# Patient Record
Sex: Female | Born: 1990 | Hispanic: Yes | Marital: Single | Smoking: Never smoker
Health system: Southern US, Community
[De-identification: ages and names within clinical notes are randomized; demographics above are authoritative.]

## PROBLEM LIST (undated history)

## (undated) DIAGNOSIS — F209 Schizophrenia, unspecified: Secondary | ICD-10-CM

---

## 2013-02-09 ENCOUNTER — Emergency Department (HOSPITAL_COMMUNITY)
Admission: EM | Admit: 2013-02-09 | Discharge: 2013-02-09 | Disposition: A | Discharge status: Home / Self Care | Payer: Self-pay | Attending: Emergency Medicine | Admitting: Emergency Medicine

## 2013-02-09 ENCOUNTER — Encounter (HOSPITAL_COMMUNITY): Payer: Self-pay | Admitting: Emergency Medicine

## 2013-02-09 DIAGNOSIS — G249 Dystonia, unspecified: Secondary | ICD-10-CM

## 2013-02-09 DIAGNOSIS — G248 Other dystonia: Secondary | ICD-10-CM | POA: Insufficient documentation

## 2013-02-09 DIAGNOSIS — R279 Unspecified lack of coordination: Secondary | ICD-10-CM | POA: Insufficient documentation

## 2013-02-09 DIAGNOSIS — F209 Schizophrenia, unspecified: Secondary | ICD-10-CM | POA: Insufficient documentation

## 2013-02-09 DIAGNOSIS — Z3202 Encounter for pregnancy test, result negative: Secondary | ICD-10-CM | POA: Insufficient documentation

## 2013-02-09 DIAGNOSIS — R000 Tachycardia, unspecified: Secondary | ICD-10-CM | POA: Insufficient documentation

## 2013-02-09 HISTORY — DX: Schizophrenia, unspecified: F20.9

## 2013-02-09 LAB — URINALYSIS, ROUTINE W REFLEX MICROSCOPIC
Bilirubin Urine: NEGATIVE
GLUCOSE, UA: NEGATIVE mg/dL
HGB URINE DIPSTICK: NEGATIVE
Ketones, ur: NEGATIVE mg/dL
Nitrite: NEGATIVE
PH: 7 (ref 5.0–8.0)
Protein, ur: NEGATIVE mg/dL
Specific Gravity, Urine: 1.006 (ref 1.005–1.030)
UROBILINOGEN UA: 0.2 mg/dL (ref 0.0–1.0)

## 2013-02-09 LAB — URINE MICROSCOPIC-ADD ON

## 2013-02-09 LAB — POCT PREGNANCY, URINE: Preg Test, Ur: NEGATIVE

## 2013-02-09 LAB — COMPREHENSIVE METABOLIC PANEL
ALBUMIN: 4 g/dL (ref 3.5–5.2)
ALK PHOS: 88 U/L (ref 39–117)
ALT: 17 U/L (ref 0–35)
AST: 27 U/L (ref 0–37)
BILIRUBIN TOTAL: 0.2 mg/dL — AB (ref 0.3–1.2)
BUN: 5 mg/dL — ABNORMAL LOW (ref 6–23)
CHLORIDE: 102 meq/L (ref 96–112)
CO2: 21 mEq/L (ref 19–32)
Calcium: 9.1 mg/dL (ref 8.4–10.5)
Creatinine, Ser: 0.54 mg/dL (ref 0.50–1.10)
GFR calc Af Amer: >90 mL/min (ref >90)
GFR calc non Af Amer: >90 mL/min (ref >90)
GLUCOSE: 113 mg/dL — AB (ref 70–99)
POTASSIUM: 4.1 meq/L (ref 3.7–5.3)
Sodium: 138 mEq/L (ref 137–147)
Total Protein: 8.3 g/dL (ref 6.0–8.3)

## 2013-02-09 LAB — CBC
HEMATOCRIT: 40.7 % (ref 36.0–46.0)
HEMOGLOBIN: 13.4 g/dL (ref 12.0–15.0)
MCH: 28.4 pg (ref 26.0–34.0)
MCHC: 32.9 g/dL (ref 30.0–36.0)
MCV: 86.2 fL (ref 78.0–100.0)
Platelets: 321 10*3/uL (ref 150–400)
RBC: 4.72 MIL/uL (ref 3.87–5.11)
RDW: 13.4 % (ref 11.5–15.5)
WBC: 11.2 10*3/uL — AB (ref 4.0–10.5)

## 2013-02-09 LAB — RAPID URINE DRUG SCREEN, HOSP PERFORMED
Amphetamines: NOT DETECTED
Barbiturates: NOT DETECTED
Benzodiazepines: NOT DETECTED
COCAINE: NOT DETECTED
Opiates: NOT DETECTED
TETRAHYDROCANNABINOL: NOT DETECTED

## 2013-02-09 LAB — ACETAMINOPHEN LEVEL

## 2013-02-09 LAB — ETHANOL

## 2013-02-09 LAB — SALICYLATE LEVEL: Salicylate Lvl: <2 mg/dL — ABNORMAL LOW (ref 2.8–20.0)

## 2013-02-09 MED ORDER — SODIUM CHLORIDE 0.9 % IV BOLUS (SEPSIS)
1000.0000 mL | Freq: Once | INTRAVENOUS | Status: AC
  Administered 2013-02-09: 1000 mL via INTRAVENOUS

## 2013-02-09 MED ORDER — LORAZEPAM 2 MG/ML IJ SOLN
1.0000 mg | Freq: Once | INTRAMUSCULAR | Status: AC
  Administered 2013-02-09: 1 mg via INTRAVENOUS
  Filled 2013-02-09: qty 1

## 2013-02-09 MED ORDER — DIPHENHYDRAMINE HCL 25 MG PO TABS: 25.0000 mg | ORAL_TABLET | Freq: Four times a day (QID) | ORAL | Status: DC

## 2013-02-09 MED ORDER — DIPHENHYDRAMINE HCL 50 MG/ML IJ SOLN
25.0000 mg | Freq: Once | INTRAMUSCULAR | Status: AC
  Administered 2013-02-09: 25 mg via INTRAVENOUS
  Filled 2013-02-09: qty 1

## 2013-02-09 NOTE — ED Notes (Signed)
Pt verbalized understanding of d/c instructions Pt ambulatory to d/c with family

## 2013-02-09 NOTE — ED Notes (Signed)
Per EMS, [facility] picked [facility] as [facility] has been seen [facility]. [facility] called 911 for possible overdose. Patient was at night club earlier in evening, reports drinking a margarita and pina colada at club. On arrival patient denies ETOH, states she had a sip of someone elses drink. Patient denies any prior similar episodes, patient mother states this happened about 2 months ago and [facility].

## 2013-02-09 NOTE — Discharge Instructions (Signed)
It is unclear at this time what caused your dystonic reaction.  Usually it is in response to a medication or drug.  It is important for you to followup with a neurologist and your mental health provider.  You will need to be careful about what medications you take.  Return to emergency department for worsening condition or new concerning symptoms.   Reaccin distnica (Dystonic Reaction) La reaccin distnica se produce generalmente como efecto secundario a una medicacin especfica. Generalmente es por medicamentos que se utilizan para tratar trastornos psicolgicos o psiquitricos. Tambin pueden producirse debido a otros medicamentos de uso frecuente como el antihistamnicos, cimetidina, doxepina y bromocriptina. El motivo para que esta reaccin se produzca es que los patrones normales de los receptores nerviosos son alterados por un medicamento especfico y el desequilibrio produce diferentes tipos de espasmos musculares. No se trata de alergia al medicamento. Es Neomia Dearuna respuesta particular al medicamento especfico que usted est tomando. DIAGNSTICO Este diagnstico (determinar cul es el problema) se realiza en base a los sntomas (problemas) ms evidentes que se relacionan con la contraccin de mltiples msculos del organismo, y en general, la rpida respuesta al State Linetratamiento. Debido a que se produce la contraccin de mltiples grupos musculares, se asocia a movimientos anormales del rostro, de la [place]engua, el cuello, el abdomen (vientre), la espalda y Buckhornmuecas extraas. Este trastorno rara vez pone en peligro la vida y generalmente responde minutos despus de administrar Benadryl, Cogentin o Valium. Aunque en general es atemorizante, se resolver en pocos minutos. Si la reaccin no es consecuencia del consumo de medicamentos, debern realizarle estudios adicionales para descartar otras causas. INSTRUCCIONES PARA EL CUIDADO DOMICILIARIO  En general, despus que la reaccin desaparece no retorna.  Evite  en el futuro el uso de medicamentos que se han considerado como la causa del trastorno.  No conduzca ni realice tareas despus del tratamiento hasta que haya terminado los medicamentos o hasta que tenga la autorizacin del profesional que lo asiste.  Vea a su profesional si los sntomas que lo llevaron a la consulta o a la sala de Clinical biochemistemergencias aparecen nuevamente. EST SEGURO QUE:   Comprende las instrucciones para el alta mdica.  Controlar su enfermedad.  Solicitar atencin mdica de inmediato segn las indicaciones. Document Released: 01/03/2005 Document Revised: 03/28/2011 [facility] Patient Information 2014 [place], [place].

## 2013-02-09 NOTE — ED Provider Notes (Signed)
CSN: [id number]     Arrival date & time 02/09/13  [id number] History   First MD Initiated Contact with Patient 02/09/13 0239     Chief Complaint  Patient presents with  . Medical Clearance   (Consider location/radiation/quality/duration/timing/severity/associated sxs/prior Treatment) HPI 23 year old female presents [facility].  History given by patient, family, and EMS.  Per report, patient was out at a club, and began acting bizarrely.  Patient reports drinking a little bit of a margarita and a pina colada.  They were not her drinks.  She is unsure if she was slipped something in the drink.  Patient reports eating shrimp, beans and rice, and cheese.  Initially, she denies this ever happened before, but then admits that happened 2 months ago.  Family reports [facility].  Patient has past history of schizophrenia.  It is reported that she takes Risperdal, but it is unclear if she is actually taking it.  She is a poor historian.  Patient with deviation of the eyes upward, and to the right.  She is unable to look down.  She periodically opens her mouth, and attempts to vomit, but is unable to.  She occasionally  keeps her mouth open, rigidly.  Patient has some tonic activity of her extremities without seizure.  She denies any ingestion of any illicit substances.  [facility] and transferred over to to consider for orders. Past Medical History  Diagnosis Date  . Schizophrenia    No past surgical history on file. No family history on file. History  Substance Use Topics  . Smoking status: Not on file  . Smokeless tobacco: Not on file  . Alcohol Use: Yes   OB History   Grav Para Term Preterm Abortions TAB SAB Ect Mult Living                 Review of Systems  Unable to perform ROS: Mental status change    Allergies  Review of patient's allergies indicates no known allergies.  Home  Medications   Current Outpatient Rx  Name  Route  Sig  Dispense  Refill  . risperidone (RISPERDAL) 4 MG tablet   Oral   Take 4 mg by mouth at bedtime.          BP 140/88  Pulse 125  Temp(Src) 97.5 F (36.4 C) (Oral)  Resp 18  Ht 5\' 2"  (1.575 m)  Wt 140 lb (63.504 kg)  BMI 25.60 kg/m2  SpO2 97% Physical Exam  Nursing note and vitals reviewed. Constitutional: She is oriented to person, place, and time. She appears well-developed and well-nourished. She appears distressed.  HENT:  Head: Normocephalic and atraumatic.  Right Ear: External ear normal.  Left Ear: External ear normal.  Nose: Nose normal.  Mouth/Throat: Oropharynx is clear and moist.  Eyes: Conjunctivae are normal.  deviation of eyes up into the right. some nystagmus  Neck: Normal range of motion. Neck supple. No JVD present. No tracheal deviation present. No thyromegaly present.  Cardiovascular: Regular rhythm, normal heart sounds and intact distal pulses.  Exam reveals no gallop and no friction rub.   No murmur heard. Tachycardia  Pulmonary/Chest: Effort normal and breath sounds normal. No stridor. No respiratory distress. She has no wheezes. She has no rales. She exhibits no tenderness.  Abdominal: Soft. Bowel sounds are normal. She exhibits no distension and no mass. There is no tenderness. There is no rebound  and no guarding.  Musculoskeletal: Normal range of motion. She exhibits no edema and no tenderness.  Lymphadenopathy:    She has no cervical adenopathy.  Neurological: She is alert and oriented to person, place, and time. No cranial nerve deficit. She exhibits abnormal muscle tone ( increased rigidity noted). Coordination abnormal.  Skin: Skin is warm and dry. No rash noted. No erythema. No pallor.  Psychiatric: She has a normal mood and affect. Her behavior is normal. Judgment and thought content normal.    ED Course  Procedures (including critical care time) Labs Review Labs Reviewed  URINALYSIS,  ROUTINE W REFLEX MICROSCOPIC - Abnormal; Notable for the following:    APPearance CLOUDY (*)    Leukocytes, UA TRACE (*)    All other components within normal limits  CBC - Abnormal; Notable for the following:    WBC 11.2 (*)    All other components within normal limits  COMPREHENSIVE METABOLIC PANEL - Abnormal; Notable for the following:    Glucose, Bld 113 (*)    BUN 5 (*)    Total Bilirubin 0.2 (*)    All other components within normal limits  SALICYLATE LEVEL - Abnormal; Notable for the following:    Salicylate Lvl <2.0 (*)    All other components within normal limits  URINE MICROSCOPIC-ADD ON - Abnormal; Notable for the following:    Squamous Epithelial / LPF FEW (*)    All other components within normal limits  URINE RAPID DRUG SCREEN (HOSP PERFORMED)  ACETAMINOPHEN LEVEL  ETHANOL  POCT PREGNANCY, URINE   Imaging Review No results found.  EKG Interpretation    Date/Time:  Saturday February 09 2013 02:49:39 EST Ventricular Rate:  156 PR Interval:  137 QRS Duration: 74 QT Interval:  247 QTC Calculation: 398 R Axis:   45 Text Interpretation:  Sinus tachycardia Artifact in lead(s) I II III aVR aVL aVF No old tracing to compare Confirmed by Docia Klar  MD, Sultana Tierney ([id number]) on 02/09/2013 2:53:12 AM            MDM   1. Dystonia    23 year old female with what appears to be a dystonic reaction.  Unknown trigger.  After administration of Benadryl, patient with complete resolution of symptoms.  Workup has been unremarkable.    Olivia Mackie, MD 02/09/13 [phone]

## 2013-02-09 NOTE — ED Notes (Signed)
Patient presents with eyes rolling back in her head, unable to move them forward. Patient states she only drank water tonight at the club, then states she had only a sip of her friends drink. Patient now more alert, able to communicate more effectively. Patient eyes now at a normal gaze, able to speak in coherent sentences. Patient states she had a margarita and a Cabin crewpina colada tonight, that someone else brought her drinks to her. Patient's family at bedside.

## 2013-02-09 NOTE — ED Notes (Signed)
Bed: RESA Expected date: 02/09/13 Expected time: 2:30 AM Means of arrival: Ambulance Comments: Res A, EMS, 22 F, Med Clearance

## 2013-05-18 ENCOUNTER — Encounter (HOSPITAL_COMMUNITY): Payer: Self-pay | Admitting: Emergency Medicine

## 2013-05-18 ENCOUNTER — Emergency Department (HOSPITAL_COMMUNITY)
Admission: EM | Admit: 2013-05-18 | Discharge: 2013-05-18 | Disposition: A | Discharge status: Home / Self Care | Payer: Self-pay | Attending: Emergency Medicine | Admitting: Emergency Medicine

## 2013-05-18 DIAGNOSIS — Z3202 Encounter for pregnancy test, result negative: Secondary | ICD-10-CM | POA: Insufficient documentation

## 2013-05-18 DIAGNOSIS — Z8659 Personal history of other mental and behavioral disorders: Secondary | ICD-10-CM | POA: Insufficient documentation

## 2013-05-18 DIAGNOSIS — F3289 Other specified depressive episodes: Secondary | ICD-10-CM | POA: Insufficient documentation

## 2013-05-18 DIAGNOSIS — F329 Major depressive disorder, single episode, unspecified: Secondary | ICD-10-CM | POA: Insufficient documentation

## 2013-05-18 DIAGNOSIS — R000 Tachycardia, unspecified: Secondary | ICD-10-CM | POA: Insufficient documentation

## 2013-05-18 DIAGNOSIS — G248 Other dystonia: Secondary | ICD-10-CM | POA: Insufficient documentation

## 2013-05-18 DIAGNOSIS — G249 Dystonia, unspecified: Secondary | ICD-10-CM

## 2013-05-18 LAB — CBC WITH DIFFERENTIAL/PLATELET
Basophils Absolute: 0 10*3/uL (ref 0.0–0.1)
Basophils Relative: 0 % (ref 0–1)
Eosinophils Absolute: 0.7 10*3/uL (ref 0.0–0.7)
Eosinophils Relative: 4 % (ref 0–5)
HEMATOCRIT: 38.6 % (ref 36.0–46.0)
Hemoglobin: 12.9 g/dL (ref 12.0–15.0)
LYMPHS ABS: 4.7 10*3/uL — AB (ref 0.7–4.0)
Lymphocytes Relative: 29 % (ref 12–46)
MCH: 28.2 pg (ref 26.0–34.0)
MCHC: 33.4 g/dL (ref 30.0–36.0)
MCV: 84.3 fL (ref 78.0–100.0)
MONO ABS: 1.4 10*3/uL — AB (ref 0.1–1.0)
MONOS PCT: 8 % (ref 3–12)
NEUTROS ABS: 9.8 10*3/uL — AB (ref 1.7–7.7)
NEUTROS PCT: 59 % (ref 43–77)
Platelets: 368 10*3/uL (ref 150–400)
RBC: 4.58 MIL/uL (ref 3.87–5.11)
RDW: 13.9 % (ref 11.5–15.5)
WBC: 16.6 10*3/uL — ABNORMAL HIGH (ref 4.0–10.5)

## 2013-05-18 LAB — COMPREHENSIVE METABOLIC PANEL
ALK PHOS: 103 U/L (ref 39–117)
ALT: 16 U/L (ref 0–35)
AST: 23 U/L (ref 0–37)
Albumin: 3.7 g/dL (ref 3.5–5.2)
BUN: 5 mg/dL — AB (ref 6–23)
CHLORIDE: 101 meq/L (ref 96–112)
CO2: 19 mEq/L (ref 19–32)
Calcium: 8.8 mg/dL (ref 8.4–10.5)
Creatinine, Ser: 0.51 mg/dL (ref 0.50–1.10)
GLUCOSE: 109 mg/dL — AB (ref 70–99)
POTASSIUM: 3.8 meq/L (ref 3.7–5.3)
Sodium: 138 mEq/L (ref 137–147)
Total Protein: 7.8 g/dL (ref 6.0–8.3)

## 2013-05-18 LAB — RAPID URINE DRUG SCREEN, HOSP PERFORMED
AMPHETAMINES: NOT DETECTED
BENZODIAZEPINES: NOT DETECTED
Barbiturates: NOT DETECTED
COCAINE: NOT DETECTED
Opiates: NOT DETECTED
TETRAHYDROCANNABINOL: NOT DETECTED

## 2013-05-18 LAB — URINALYSIS, ROUTINE W REFLEX MICROSCOPIC
BILIRUBIN URINE: NEGATIVE
Glucose, UA: NEGATIVE mg/dL
Hgb urine dipstick: NEGATIVE
Ketones, ur: NEGATIVE mg/dL
LEUKOCYTES UA: NEGATIVE
NITRITE: NEGATIVE
PROTEIN: NEGATIVE mg/dL
Specific Gravity, Urine: 1.01 (ref 1.005–1.030)
Urobilinogen, UA: 0.2 mg/dL (ref 0.0–1.0)
pH: 6.5 (ref 5.0–8.0)

## 2013-05-18 LAB — ETHANOL: Alcohol, Ethyl (B): <11 mg/dL (ref 0–11)

## 2013-05-18 LAB — SALICYLATE LEVEL: Salicylate Lvl: <2 mg/dL — ABNORMAL LOW (ref 2.8–20.0)

## 2013-05-18 LAB — PREGNANCY, URINE: Preg Test, Ur: NEGATIVE

## 2013-05-18 MED ORDER — DIPHENHYDRAMINE HCL 50 MG/ML IJ SOLN
25.0000 mg | Freq: Once | INTRAMUSCULAR | Status: AC
  Administered 2013-05-18: 25 mg via INTRAVENOUS
  Filled 2013-05-18: qty 1

## 2013-05-18 MED ORDER — DIPHENHYDRAMINE HCL 25 MG PO CAPS: 25.0000 mg | ORAL_CAPSULE | Freq: Four times a day (QID) | ORAL | Status: DC | PRN

## 2013-05-18 MED ORDER — SODIUM CHLORIDE 0.9 % IV BOLUS (SEPSIS)
1000.0000 mL | Freq: Once | INTRAVENOUS | Status: AC
  Administered 2013-05-18: 1000 mL via INTRAVENOUS

## 2013-05-18 NOTE — ED Provider Notes (Addendum)
CSN: [id number]     Arrival date & time 05/18/13  1823 History   First MD Initiated Contact with Patient 05/18/13 1914     Chief Complaint  Patient presents with  . facial weakness      (Consider location/radiation/quality/duration/timing/severity/associated sxs/prior Treatment) The history is provided by the patient. The history is limited by the condition of the patient.   Marisa Chambers is a 23 y.o. female history of schizophrenia here presenting with possible dystonia. She states that she has been taking the herbal and probiotic detox pill daily and took in an hour prior to arrival. She then developed some dystonia as per EMS. She was looking up and having facial spasms. In January for the same thing and improved with Benadryl. Denies overdosing on her medicines to deny suicidal homicidal ideations.    Past Medical History  Diagnosis Date  . Schizophrenia    History reviewed. No pertinent past surgical history. No family history on file. History  Substance Use Topics  . Smoking status: Never Smoker   . Smokeless tobacco: Not on file  . Alcohol Use: Yes   OB History   Grav Para Term Preterm Abortions TAB SAB Ect Mult Living                 Review of Systems  Musculoskeletal:       Muscle spasms   All other systems reviewed and are negative.     Allergies  Review of patient's allergies indicates no known allergies.  Home Medications   Prior to Admission medications   Medication Sig Start Date End Date Taking? Authorizing Provider  diphenhydrAMINE (BENADRYL) 25 MG tablet Take 1 tablet (25 mg total) by mouth every 6 (six) hours. As needed for dystonic reaction 02/09/13   Olivia Mackielga M Otter, MD   BP 127/80  Pulse 117  Temp(Src) 97.9 F (36.6 C) (Oral)  Resp 18  SpO2 96% Physical Exam  Nursing note and vitals reviewed. Constitutional: She is oriented to person, place, and time.  Eyes looking up, tight facial muscles   HENT:  Head: Normocephalic.   Mouth/Throat: Oropharynx is clear and moist.  Mild trismus.   Eyes: Conjunctivae and EOM are normal. Pupils are equal, round, and reactive to light.  Looking up constantly.   Neck: Normal range of motion. Neck supple.  Cardiovascular: Regular rhythm and normal heart sounds.   Tachycardic   Pulmonary/Chest: Effort normal and breath sounds normal. No respiratory distress. She has no wheezes. She has no rales.  Abdominal: Soft. Bowel sounds are normal. She exhibits no distension. There is no tenderness. There is no rebound.  Musculoskeletal: Normal range of motion. She exhibits no edema and no tenderness.  Neurological: She is alert and oriented to person, place, and time. No cranial nerve deficit. Coordination normal.  No tremors. Nl gait. Nl strength throughout   Skin: Skin is warm and dry.  Psychiatric:  Slightly depressed, not suicidal     ED Course  Procedures (including critical care time) Labs Review Labs Reviewed  CBC WITH DIFFERENTIAL - Abnormal; Notable for the following:    WBC 16.6 (*)    Neutro Abs 9.8 (*)    Lymphs Abs 4.7 (*)    Monocytes Absolute 1.4 (*)    All other components within normal limits  COMPREHENSIVE METABOLIC PANEL - Abnormal; Notable for the following:    Glucose, Bld 109 (*)    BUN 5 (*)    Total Bilirubin <0.2 (*)    All  other components within normal limits  SALICYLATE LEVEL - Abnormal; Notable for the following:    Salicylate Lvl <2.0 (*)    All other components within normal limits  ETHANOL  URINE RAPID DRUG SCREEN (HOSP PERFORMED)  URINALYSIS, ROUTINE W REFLEX MICROSCOPIC  PREGNANCY, URINE    Imaging Review No results found.   EKG Interpretation None      MDM   Final diagnoses:  None    Marisa Chambers is a 23 y.o. female here with dystonic reaction. Not on antipsychotic right now. Unclear what its from. She is taking herbal supplement which I asked her to stop taking. Given benadryl in the ED, tachycardia  improved and now not dystonic, trismus improved. Labs showed leukocytosis but no fever and no other signs of sepsis. I doubt NMS or serotonin syndrome.  UDS neg. Stable for d/c.      Richardean Canalavid H Yao, MD 05/18/13 2023  Richardean Canalavid H Yao, MD 05/18/13 2033

## 2013-05-18 NOTE — ED Notes (Signed)
Pt rolling eyes in back of head and will not look at this RN. Pupil's checked with pin light and patient then focuses eyes on RN. Pt holding eyes open with fingers periodically.

## 2013-05-18 NOTE — ED Notes (Signed)
Per EMS - pt comes from home where she lives with her family with c/o facial paralysis.  Pt took an herbal and probiotic detox pill to help her sleep @ 1 hour PTA.  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  Pt was seen here in January 2015 for dystonia.  Pt's vitals on scene:  142/78, st 120, RR 18, 98 on RA.

## 2013-05-18 NOTE — ED Notes (Signed)
Bed: [id number] Expected date: 05/18/13 Expected time: 6:23 PM Means of arrival: Ambulance Comments: Evaluation

## 2013-05-18 NOTE — ED Notes (Signed)
Pt ambulated to restroom with steady gait.

## 2013-05-18 NOTE — Discharge Instructions (Signed)
Avoid taking herbal supplement.   Take benadryl as needed for muscle cramps.   Follow up with your doctor.   Return to ER if you have fever, worse cramps or spasms.

## 2013-06-01 ENCOUNTER — Emergency Department (HOSPITAL_COMMUNITY)
Admission: EM | Admit: 2013-06-01 | Discharge: 2013-06-01 | Disposition: A | Discharge status: Home / Self Care | Payer: Self-pay | Attending: Emergency Medicine | Admitting: Emergency Medicine

## 2013-06-01 ENCOUNTER — Encounter (HOSPITAL_COMMUNITY): Payer: Self-pay | Admitting: Emergency Medicine

## 2013-06-01 DIAGNOSIS — Z3202 Encounter for pregnancy test, result negative: Secondary | ICD-10-CM | POA: Insufficient documentation

## 2013-06-01 DIAGNOSIS — N760 Acute vaginitis: Secondary | ICD-10-CM | POA: Insufficient documentation

## 2013-06-01 DIAGNOSIS — R109 Unspecified abdominal pain: Secondary | ICD-10-CM

## 2013-06-01 DIAGNOSIS — B9689 Other specified bacterial agents as the cause of diseases classified elsewhere: Secondary | ICD-10-CM | POA: Insufficient documentation

## 2013-06-01 DIAGNOSIS — A499 Bacterial infection, unspecified: Secondary | ICD-10-CM | POA: Insufficient documentation

## 2013-06-01 DIAGNOSIS — Z8659 Personal history of other mental and behavioral disorders: Secondary | ICD-10-CM | POA: Insufficient documentation

## 2013-06-01 LAB — URINALYSIS, ROUTINE W REFLEX MICROSCOPIC
Bilirubin Urine: NEGATIVE
Glucose, UA: NEGATIVE mg/dL
Hgb urine dipstick: NEGATIVE
Ketones, ur: NEGATIVE mg/dL
LEUKOCYTES UA: NEGATIVE
NITRITE: NEGATIVE
PROTEIN: NEGATIVE mg/dL
SPECIFIC GRAVITY, URINE: 1.011 (ref 1.005–1.030)
UROBILINOGEN UA: 0.2 mg/dL (ref 0.0–1.0)
pH: 7 (ref 5.0–8.0)

## 2013-06-01 LAB — CBC WITH DIFFERENTIAL/PLATELET
BASOS PCT: 0 % (ref 0–1)
Basophils Absolute: 0 10*3/uL (ref 0.0–0.1)
EOS ABS: 0.7 10*3/uL (ref 0.0–0.7)
Eosinophils Relative: 6 % — ABNORMAL HIGH (ref 0–5)
HCT: 36.7 % (ref 36.0–46.0)
Hemoglobin: 12.2 g/dL (ref 12.0–15.0)
Lymphocytes Relative: 23 % (ref 12–46)
Lymphs Abs: 2.7 10*3/uL (ref 0.7–4.0)
MCH: 28.2 pg (ref 26.0–34.0)
MCHC: 33.2 g/dL (ref 30.0–36.0)
MCV: 84.8 fL (ref 78.0–100.0)
MONOS PCT: 8 % (ref 3–12)
Monocytes Absolute: 0.9 10*3/uL (ref 0.1–1.0)
NEUTROS PCT: 63 % (ref 43–77)
Neutro Abs: 7.2 10*3/uL (ref 1.7–7.7)
PLATELETS: 407 10*3/uL — AB (ref 150–400)
RBC: 4.33 MIL/uL (ref 3.87–5.11)
RDW: 14.5 % (ref 11.5–15.5)
WBC: 11.5 10*3/uL — ABNORMAL HIGH (ref 4.0–10.5)

## 2013-06-01 LAB — WET PREP, GENITAL
TRICH WET PREP: NONE SEEN
YEAST WET PREP: NONE SEEN

## 2013-06-01 LAB — COMPREHENSIVE METABOLIC PANEL
ALT: 14 U/L (ref 0–35)
AST: 19 U/L (ref 0–37)
Albumin: 3.8 g/dL (ref 3.5–5.2)
Alkaline Phosphatase: 93 U/L (ref 39–117)
BUN: 6 mg/dL (ref 6–23)
CALCIUM: 9.4 mg/dL (ref 8.4–10.5)
CO2: 23 mEq/L (ref 19–32)
Chloride: 101 mEq/L (ref 96–112)
Creatinine, Ser: 0.55 mg/dL (ref 0.50–1.10)
GFR calc non Af Amer: >90 mL/min (ref >90)
GLUCOSE: 102 mg/dL — AB (ref 70–99)
POTASSIUM: 4 meq/L (ref 3.7–5.3)
Sodium: 137 mEq/L (ref 137–147)
TOTAL PROTEIN: 7.7 g/dL (ref 6.0–8.3)
Total Bilirubin: 0.3 mg/dL (ref 0.3–1.2)

## 2013-06-01 LAB — LIPASE, BLOOD: LIPASE: 26 U/L (ref 11–59)

## 2013-06-01 LAB — HIV ANTIBODY (ROUTINE TESTING W REFLEX): HIV 1&2 Ab, 4th Generation: NONREACTIVE

## 2013-06-01 LAB — POC URINE PREG, ED: PREG TEST UR: NEGATIVE

## 2013-06-01 MED ORDER — METRONIDAZOLE 500 MG PO TABS
2000.0000 mg | ORAL_TABLET | Freq: Once | ORAL | Status: AC
  Administered 2013-06-01: 2000 mg via ORAL
  Filled 2013-06-01: qty 4

## 2013-06-01 MED ORDER — PANTOPRAZOLE SODIUM 20 MG PO TBEC: 20.0000 mg | DELAYED_RELEASE_TABLET | Freq: Every day | ORAL | Status: AC

## 2013-06-01 NOTE — ED Notes (Signed)
Pt did not have a PIV on arrival on 06/01/13.

## 2013-06-01 NOTE — ED Provider Notes (Signed)
CSN: [id number]     Arrival date & time 06/01/13  1009 History   First MD Initiated Contact with Patient 06/01/13 1059     Chief Complaint  Patient presents with  . Abdominal Pain     (Consider location/radiation/quality/duration/timing/severity/associated sxs/prior Treatment) Patient is a 23 y.o. female presenting with abdominal pain. The history is provided by the patient.  Abdominal Pain Pain location:  Epigastric Pain quality: cramping and dull   Pain quality: not aching, not bloating, not burning, not gnawing and not heavy   Pain radiates to:  Does not radiate Pain severity:  No pain Onset quality:  Unable to specify Duration:  4 days Timing:  Intermittent Progression:  Unchanged Chronicity:  New Context: alcohol use   Context: not diet changes, not eating, not medication withdrawal, not previous surgeries, not recent illness, not recent sexual activity, not retching, not sick contacts, not suspicious food intake and not trauma     Past Medical History  Diagnosis Date  . Schizophrenia    No past surgical history on file. No family history on file. History  Substance Use Topics  . Smoking status: Never Smoker   . Smokeless tobacco: Not on file  . Alcohol Use: Yes   OB History   Grav Para Term Preterm Abortions TAB SAB Ect Mult Living                 Review of Systems  Gastrointestinal: Positive for abdominal pain.  All other systems reviewed and are negative.     Allergies  Review of patient's allergies indicates no known allergies.  Home Medications   Prior to Admission medications   Medication Sig Start Date End Date Taking? Authorizing Provider  acetaminophen (TYLENOL) 325 MG tablet Take 325 mg by mouth every 6 (six) hours as needed for mild pain.   Yes Historical Provider, MD  Multiple Vitamin (MULTIVITAMIN WITH MINERALS) TABS tablet Take 1 tablet by mouth daily.   Yes Historical Provider, MD   BP 121/82  Pulse 78  Temp(Src) 98.5 F (36.9 C)  (Oral)  Resp 20  SpO2 97%  LMP 05/25/2013 Physical Exam  Nursing note and vitals reviewed. Constitutional: She is oriented to person, place, and time. She appears well-developed and well-nourished.  HENT:  Head: Normocephalic and atraumatic.  Eyes: Conjunctivae and EOM are normal. Pupils are equal, round, and reactive to light.  Neck: Normal range of motion. Neck supple.  Cardiovascular: Normal rate, regular rhythm, normal heart sounds and intact distal pulses.   Pulmonary/Chest: Effort normal and breath sounds normal.  Abdominal: Soft. Bowel sounds are normal. She exhibits no distension and no mass. There is no tenderness. There is no rebound and no guarding.  Genitourinary: Vagina normal. No vaginal discharge found.  Musculoskeletal: Normal range of motion.  Neurological: She is alert and oriented to person, place, and time. She has normal reflexes.  Skin: Skin is warm and dry.  Psychiatric: She has a normal mood and affect. Her behavior is normal. Judgment and thought content normal.    ED Course  Procedures (including critical care time) Labs Review Labs Reviewed  WET PREP, GENITAL - Abnormal; Notable for the following:    Clue Cells Wet Prep HPF POC FEW (*)    WBC, Wet Prep HPF POC FEW (*)    All other components within normal limits  CBC WITH DIFFERENTIAL - Abnormal; Notable for the following:    WBC 11.5 (*)    Platelets 407 (*)    Eosinophils Relative 6 (*)  All other components within normal limits  COMPREHENSIVE METABOLIC PANEL - Abnormal; Notable for the following:    Glucose, Bld 102 (*)    All other components within normal limits  GC/CHLAMYDIA PROBE AMP  LIPASE, BLOOD  URINALYSIS, ROUTINE W REFLEX MICROSCOPIC  HIV ANTIBODY (ROUTINE TESTING)  POC URINE PREG, ED    Imaging Review No results found.   EKG Interpretation None      MDM   Final diagnoses:  Abdominal pain  Bacterial vaginosis    22 y.o. Female with four day history of intermittent  abdominal pain in the epigastrium with fluid intake but not with food.  She did drink alcohol during this episode.  Her exam reveals no ttp of her abdomen.  She has a normal pelvic exam.  Plan advise avoid alcohol and follow up if ongoing pain or worse Wet prep significant of clue cells c.w. Bv.Plan ppi.     Hilario Quarryanielle S Nylah Butkus, MD 06/02/13 [phone]

## 2013-06-01 NOTE — Discharge Instructions (Signed)
Bacterial Vaginosis °Bacterial vaginosis is an infection of the vagina. It happens when too many of certain germs (bacteria) grow in the vagina. °HOME CARE °· Take your medicine as told by your doctor. °· Finish your medicine even if you start to feel better. °· Do not have sex until you finish your medicine and are better. °· Tell your sex partner that you have an infection. They should see their doctor for treatment. °· Practice safe sex. Use condoms. Have only one sex partner. °GET HELP IF: °· You are not getting better after 3 days of treatment. °· You have more grey fluid (discharge) coming from your vagina than before. °· You have more pain than before. °· You have a fever. °MAKE SURE YOU:  °· Understand these instructions. °· Will watch your condition. °· Will get help right away if you are not doing well or get worse. °Document Released: 10/13/2007 Document Revised: 10/24/2012 Document Reviewed: 08/15/2012 °ExitCare® Patient Information ©2014 Exit[employer], LLC. °Abdominal Pain, Adult °Many things can cause belly (abdominal) pain. Most times, the belly pain is not dangerous. Many cases of belly pain can be watched and treated at home. °HOME CARE  °· Do not take medicines that help you go poop (laxatives) unless told to by your doctor. °· Only take medicine as told by your doctor. °· Eat or drink as told by your doctor. Your doctor will tell you if you should be on a special diet. °GET HELP IF: °· You do not know what is causing your belly pain. °· You have belly pain while you are sick to your stomach (nauseous) or have runny poop (diarrhea). °· You have pain while you pee or poop. °· Your belly pain wakes you up at night. °· You have belly pain that gets worse or better when you eat. °· You have belly pain that gets worse when you eat fatty foods. °GET HELP RIGHT AWAY IF:  °· The pain does not go away within 2 hours. °· You have a fever. °· You keep throwing up (vomiting). °· The pain changes and is only in the  right or left part of the belly. °· You have bloody or tarry looking poop. °MAKE SURE YOU:  °· Understand these instructions. °· Will watch your condition. °· Will get help right away if you are not doing well or get worse. °Document Released: 06/22/2007 Document Revised: 10/24/2012 Document Reviewed: 09/12/2012 °ExitCare® Patient Information ©2014 [employer] LLC. ° °

## 2013-06-01 NOTE — ED Notes (Signed)
Assumed care of patient from Trude McburneySierra Henderson, Charity fundraiserN.  Pt ambulating in room upon assessment.  Pt c/o low abdominal pain x4 days.  Pt endorses nausea, but denies vomiting.  Pt denies vaginal discharge.

## 2013-06-01 NOTE — ED Notes (Signed)
Pt c/o abd pain w/ nausea.  No vomiting or diarrhea.  Pain x 4 days.  Pain is in mid abdomen.

## 2013-06-03 LAB — GC/CHLAMYDIA PROBE AMP
CT Probe RNA: NEGATIVE
GC Probe RNA: NEGATIVE

## 2014-11-11 ENCOUNTER — Emergency Department (HOSPITAL_COMMUNITY)
Admission: EM | Admit: 2014-11-11 | Discharge: 2014-11-11 | Disposition: A | Discharge status: Home / Self Care | Payer: Self-pay | Attending: Emergency Medicine | Admitting: Emergency Medicine

## 2014-11-11 ENCOUNTER — Encounter (HOSPITAL_COMMUNITY): Payer: Self-pay | Admitting: *Deleted

## 2014-11-11 ENCOUNTER — Emergency Department (HOSPITAL_COMMUNITY): Payer: Self-pay

## 2014-11-11 DIAGNOSIS — K002 Abnormalities of size and form of teeth: Secondary | ICD-10-CM | POA: Insufficient documentation

## 2014-11-11 DIAGNOSIS — Z3202 Encounter for pregnancy test, result negative: Secondary | ICD-10-CM | POA: Insufficient documentation

## 2014-11-11 DIAGNOSIS — G249 Dystonia, unspecified: Secondary | ICD-10-CM | POA: Insufficient documentation

## 2014-11-11 DIAGNOSIS — R6883 Chills (without fever): Secondary | ICD-10-CM | POA: Insufficient documentation

## 2014-11-11 DIAGNOSIS — Z79899 Other long term (current) drug therapy: Secondary | ICD-10-CM | POA: Insufficient documentation

## 2014-11-11 DIAGNOSIS — Z8659 Personal history of other mental and behavioral disorders: Secondary | ICD-10-CM | POA: Insufficient documentation

## 2014-11-11 LAB — RAPID URINE DRUG SCREEN, HOSP PERFORMED
AMPHETAMINES: NOT DETECTED
BENZODIAZEPINES: NOT DETECTED
Barbiturates: NOT DETECTED
Cocaine: NOT DETECTED
Opiates: NOT DETECTED
Tetrahydrocannabinol: NOT DETECTED

## 2014-11-11 LAB — URINE MICROSCOPIC-ADD ON

## 2014-11-11 LAB — COMPREHENSIVE METABOLIC PANEL
ALBUMIN: 3.5 g/dL (ref 3.5–5.0)
ALT: 20 U/L (ref 14–54)
AST: 22 U/L (ref 15–41)
Alkaline Phosphatase: 82 U/L (ref 38–126)
Anion gap: 8 (ref 5–15)
BUN: <5 mg/dL — ABNORMAL LOW (ref 6–20)
CO2: 23 mmol/L (ref 22–32)
Calcium: 9.4 mg/dL (ref 8.9–10.3)
Chloride: 109 mmol/L (ref 101–111)
Creatinine, Ser: 0.63 mg/dL (ref 0.44–1.00)
GFR calc Af Amer: >60 mL/min (ref >60)
GFR calc non Af Amer: >60 mL/min (ref >60)
GLUCOSE: 108 mg/dL — AB (ref 65–99)
POTASSIUM: 4.1 mmol/L (ref 3.5–5.1)
Sodium: 140 mmol/L (ref 135–145)
Total Bilirubin: 0.4 mg/dL (ref 0.3–1.2)
Total Protein: 6.5 g/dL (ref 6.5–8.1)

## 2014-11-11 LAB — CBC WITH DIFFERENTIAL/PLATELET
Basophils Absolute: 0 10*3/uL (ref 0.0–0.1)
Basophils Relative: 0 %
Eosinophils Absolute: 0.2 10*3/uL (ref 0.0–0.7)
Eosinophils Relative: 2 %
HCT: 40.9 % (ref 36.0–46.0)
Hemoglobin: 13.7 g/dL (ref 12.0–15.0)
Lymphocytes Relative: 18 %
Lymphs Abs: 1.9 10*3/uL (ref 0.7–4.0)
MCH: 28.5 pg (ref 26.0–34.0)
MCHC: 33.5 g/dL (ref 30.0–36.0)
MCV: 85.2 fL (ref 78.0–100.0)
MONO ABS: 0.9 10*3/uL (ref 0.1–1.0)
MONOS PCT: 9 %
Neutro Abs: 7.3 10*3/uL (ref 1.7–7.7)
Neutrophils Relative %: 71 %
PLATELETS: 267 10*3/uL (ref 150–400)
RBC: 4.8 MIL/uL (ref 3.87–5.11)
RDW: 12.8 % (ref 11.5–15.5)
WBC: 10.2 10*3/uL (ref 4.0–10.5)

## 2014-11-11 LAB — URINALYSIS, ROUTINE W REFLEX MICROSCOPIC
Bilirubin Urine: NEGATIVE
Glucose, UA: NEGATIVE mg/dL
Ketones, ur: 15 mg/dL — AB
NITRITE: NEGATIVE
PH: 5.5 (ref 5.0–8.0)
Protein, ur: NEGATIVE mg/dL
Specific Gravity, Urine: 1.023 (ref 1.005–1.030)
UROBILINOGEN UA: 0.2 mg/dL (ref 0.0–1.0)

## 2014-11-11 LAB — ETHANOL: Alcohol, Ethyl (B): <5 mg/dL (ref <5)

## 2014-11-11 LAB — PREGNANCY, URINE: PREG TEST UR: NEGATIVE

## 2014-11-11 MED ORDER — DIPHENHYDRAMINE HCL 25 MG PO CAPS
25.0000 mg | ORAL_CAPSULE | Freq: Once | ORAL | Status: AC
  Administered 2014-11-11: 25 mg via ORAL
  Filled 2014-11-11: qty 1

## 2014-11-11 MED ORDER — DIPHENHYDRAMINE HCL 25 MG PO TABS: 25.0000 mg | ORAL_TABLET | Freq: Three times a day (TID) | ORAL | Status: DC | PRN

## 2014-11-11 NOTE — Discharge Instructions (Signed)
Reaccin distnica (Dystonic Reaction) La distona es una enfermedad que causa la contraccin de los msculos sin aviso previo (espasmos musculares). Puede causar sacudidas molestas e involuntarias de los grupos musculares. Esta afeccin no suele poner en peligro la vida. CAUSAS Con mayor frecuencia, la reaccin distnica es un efecto secundario de un medicamento en particular.Estas reacciones se producen cuando los patrones normales de los receptores nerviosos se alteran por un medicamento en particular, y el desequilibrio causa diversos tipos de espasmo muscular. Esta afeccin tambin puede deberse a trastornos del Harley-Davidsonsistema nervioso, como los siguientes:  Ictus.  Esclerosis mltiple (EM).  Parlisis cerebral. A veces, la causa se desconoce (idioptica). FACTORES DE RIESGO Este trastorno es ms probable en las personas que toman determinados medicamentos, en general, los medicamentos que se usan para tratar problemas psiquitricos o nuseas. SNTOMAS Los sntomas de las distona pueden variar. Entre los sntomas se pueden incluir los siguientes:  Sacudidas o espasmos musculares alrededor de los ojos (blefaroespasmo).  Calambres o arrastre Avayade los pies.  Tirones del cuello hacia un lado (tortcolis) o Wellsite geologisthacia atrs (retrcolis).  Espasmos musculares faciales.  Espasmos en la caja de la voz (laringe).  Temblores.  Posiciones extraas y dolorosas.  Calambres musculares despus del uso de los msculos.  Espasmos en los msculos de la mandbula que dificultan la apertura de la boca. DIAGNSTICO Esta afeccin suele diagnosticarse fcilmente segn los patrones de las contracciones musculares del cuerpo y la respuesta del cuerpo al tratamiento. El diagnstico tambin incluir los antecedentes mdicos y un examen fsico. Si se desconoce la causa del trastorno, pueden hacerle otros estudios. TRATAMIENTO El tratamiento de esta afeccin depende de la causa preexistente. Si la causa del  trastorno es un medicamento, es probable que el mdico le recomiende interrumpir el uso de ese medicamento. Con mayor frecuencia, este trastorno puede tratarse con medicamentos destinados a relajar los msculos y a Freight forwarderrevertir la reaccin (anticolinrgicos). Tambin puede inyectarse toxina botulnica para frenar la contraccin de los msculos afectados. En los casos graves, cuando estos tratamientos no funcionan, puede ser necesario realizar una ciruga y estimulacin cerebral. INSTRUCCIONES PARA EL CUIDADO EN EL HOGAR  Hable con el mdico acerca de cmo evitar el uso de los medicamentos que posiblemente han causado la reaccin.  No conduzca ni opere maquinaria pesada hasta que el mdico lo autorice.  Tome los medicamentos solamente como se lo haya indicado el mdico. SOLICITE ATENCIN MDICA SI:  Los sntomas originales regresan despus del Fairwatertratamiento.   Esta informacin no tiene Theme park managercomo fin reemplazar el consejo del mdico. Asegrese de hacerle al mdico cualquier pregunta que tenga.   Document Released: 01/03/2005 Document Revised: 05/20/2014 Elsevier Interactive Patient Education [employer]2016 Elsevier Inc.
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
## 2014-11-11 NOTE — ED Notes (Signed)
Pt reports being [facility] - pt was started on zyprexa during this hospitalization - pt c/o facial tension and her mother reports noting the patients eyes "rolling back" - pt in no acute distress, pt is a poor historian even utilizing a spanish interpreter phone.

## 2014-11-11 NOTE — ED Notes (Signed)
Patient left at this time with all belongings. 

## 2015-07-13 ENCOUNTER — Emergency Department (HOSPITAL_COMMUNITY)
Admission: EM | Admit: 2015-07-13 | Discharge: 2015-07-13 | Disposition: A | Discharge status: Home / Self Care | Payer: Self-pay | Attending: Emergency Medicine | Admitting: Emergency Medicine

## 2015-07-13 ENCOUNTER — Encounter (HOSPITAL_COMMUNITY): Payer: Self-pay | Admitting: Emergency Medicine

## 2015-07-13 DIAGNOSIS — G249 Dystonia, unspecified: Secondary | ICD-10-CM | POA: Insufficient documentation

## 2015-07-13 DIAGNOSIS — F209 Schizophrenia, unspecified: Secondary | ICD-10-CM | POA: Insufficient documentation

## 2015-07-13 DIAGNOSIS — M79672 Pain in left foot: Secondary | ICD-10-CM | POA: Insufficient documentation

## 2015-07-13 MED ORDER — DIPHENHYDRAMINE HCL 25 MG PO CAPS
25.0000 mg | ORAL_CAPSULE | Freq: Once | ORAL | Status: AC
  Administered 2015-07-13: 25 mg via ORAL
  Filled 2015-07-13: qty 1

## 2015-07-13 MED ORDER — DIPHENHYDRAMINE HCL 25 MG PO TABS: 50.0000 mg | ORAL_TABLET | Freq: Four times a day (QID) | ORAL | Status: AC

## 2015-07-13 NOTE — Discharge Instructions (Signed)
Distona (Dystonia) La distona es una enfermedad que causa la contraccin de los msculos sin aviso previo (espasmos musculares). Puede dificultar la realizacin de las tareas cotidianas. Hay diferentes formas de distona. La enfermedad puede afectar solo una parte del cuerpo, o bien zonas ms grandes. La distona afecta a las personas de McDonald's Corporationdiferentes formas. En Time Warneralgunas personas, es leve y desaparece con el tiempo, mientras que en otras puede requerir TEFL teachertratamiento. Si bien no hay una cura para la distona, se puede controlar la enfermedad con tratamiento. CAUSAS  Kindred HealthcareEntre las causas de la distona, se incluyen las siguientes:  Factores genticos. Esto significa que ha Lennar Corporationheredado los genes que lo ponen en riesgo de tener distona.  Una anomala en la regin del cerebro que controla los movimientos (ncleos basales). La distona tambin puede ser adquirida Arbie Cookey(secundaria). Si tiene LandAmerica Financialdistona secundaria, desarroll la enfermedad despus de:  Lesin cerebral.  Infeccin.  Una reaccin a las drogas. Es posible que la causa de la distona no se conozca (distona idioptica).  SIGNOS Y SNTOMAS Los signos y los sntomas de distona pueden depender del tipo de enfermedad que se padezca. Entre los signos y los sntomas ms frecuentes, se incluyen los siguientes:  Sacudidas o espasmos musculares alrededor de los ojos (blefaroespasmo).  Calambres o arrastre Avayade los pies.  Tirones del cuello hacia un lado (tortcolis).  Espasmos musculares faciales.  Espasmos larngeos.  Temblores.  Posiciones extraas y dolorosas.  Calambres musculares despus del uso de los msculos. DIAGNSTICO  El mdico diagnosticar la distona en funcin de los sntomas y la historia clnica. Tambin Civil engineer, contractingle realizar un examen fsico. Tambin se le pueden realizar:   Un anlisis de sangre para Engineer, manufacturingdetectar la presencia de los genes que causan distona.  Estudios por imgenes del cerebro para descartar otras causas para los  sntomas. No hay estudios que puedan diagnosticar otras causas de distona. TRATAMIENTO  No hay tratamientos que puedan curar o prevenir la distona. El tratamiento para controlar la distona puede incluir lo siguiente:   Tourist information centre managernyectar los msculos afectados con una sustancia qumica (botulina) que inhibe los espasmos musculares. Este tratamiento puede inhibir los espasmos durante [address]algunos das o algunos meses.  Medicamentos para Armed forces logistics/support/administrative officerrelajar los msculos. INSTRUCCIONES PARA EL CUIDADO EN EL HOGAR  Es posible que el mdico le sugiera que haga fisioterapia para mejorar la fuerza muscular. Siga haciendo los ejercicios de fisioterapia en su casa como se lo haya indicado el fisioterapeuta.  Asegrese de tener un buen sistema de apoyo. Informe al mdico si tiene problemas de estrs o ansiedad.  Concurra a todas las visitas de control como se lo haya indicado el mdico. Esto es importante.  Tome los medicamentos solamente como se lo haya indicado el mdico. SOLICITE ATENCIN MDICA SI:  La enfermedad est cambiando o Harrington Challengerempeora.  Necesita ms apoyo en su hogar.   Esta informacin no tiene Theme park managercomo fin reemplazar el consejo del mdico. Asegrese de hacerle al mdico cualquier pregunta que tenga.   Document Released: 04/21/2008 Document Revised: 01/24/2014 Elsevier Interactive Patient Education [employer]2016 Elsevier Inc.

## 2015-07-13 NOTE — ED Notes (Addendum)
Pt c/o right foot pain and right hand tremor x 1 month. Does not remember injury. [facility], no injury.

## 2015-07-13 NOTE — ED Provider Notes (Signed)
CSN: [id number]     Arrival date & time 07/13/15  1745 History  By signing my name below, I, Marisa Chambers, attest that this documentation has been prepared under the direction and in the presence of Terance HartKelly Tamyrah Burbage, PA-C.  Electronically Signed: Gillis EndsJasmyn B. Lyn HollingsheadAlexander, ED Scribe. 07/13/2015. 6:48 PM.    Chief Complaint  Patient presents with  . Foot Pain   The history is provided by the patient. No language interpreter was used.   HPI Comments: Ed BlalockMiriam Jaqueline Hernandez Chambers is a 25 y.o. female who presents to the Emergency Department complaining of gradual onset, constant, worsening left foot pain x 1 month. PMH significant for Schizophrenia. Pt states Marisa Chambers does not recall any injury to the foot however Marisa Chambers states Marisa Chambers does exercise (runs on treadmill). Marisa Chambers reports [facility] same complaint where Marisa Chambers received an X-ray of the extremity with no significant findings. Pt states Marisa Chambers received a post-op for her foot, but it has not relieved pain so Marisa Chambers has stopped wearing it. Pt has been taking Ibuprofen 800mg  for foot pain with no relief. Pain is exacerbated while walking.   Additionally, pt also complains of tremors of the left hand x 1 month. Marisa Chambers notes that Marisa Chambers takes medicine for Schizophrenia which Marisa Chambers believes could be causing tremors/ Marisa Chambers [facility] but Marisa Chambers reports not taking it. On review of EMR patient has a long standing history of dystonia - patient states it feels exactly the same as this.  Past Medical History  Diagnosis Date  . Schizophrenia (HCC)    History reviewed. No pertinent past surgical history. History reviewed. No pertinent family history. Social History  Substance Use Topics  . Smoking status: Never Smoker   . Smokeless tobacco: None  . Alcohol Use: Yes   OB History    No data available     Review of Systems  Constitutional: Negative for fever.  Musculoskeletal: Positive for myalgias and arthralgias.   Neurological: Positive for tremors.  All other systems reviewed and are negative.   Allergies  Review of patient's allergies indicates no known allergies.  Home Medications   Prior to Admission medications   Medication Sig Start Date End Date Taking? Authorizing Provider  acetaminophen (TYLENOL) 325 MG tablet Take 325 mg by mouth every 6 (six) hours as needed for mild pain.    Historical Provider, MD  diphenhydrAMINE (BENADRYL) 25 MG tablet Take 1 tablet (25 mg total) by mouth every 8 (eight) hours as needed (dystonic reaction). 11/11/14   Loren Raceravid Yelverton, MD  Multiple Vitamin (MULTIVITAMIN WITH MINERALS) TABS tablet Take 1 tablet by mouth daily.    Historical Provider, MD  pantoprazole (PROTONIX) 20 MG tablet Take 1 tablet (20 mg total) by mouth daily. 06/01/13   Margarita Grizzleanielle Ray, MD   BP 129/88 mmHg  Pulse 96  Temp(Src) 97.7 F (36.5 C) (Oral)  Resp 16  SpO2 100%   Physical Exam  Constitutional: Marisa Chambers is oriented to person, place, and time. Marisa Chambers appears well-developed and well-nourished. No distress.  HENT:  Head: Normocephalic and atraumatic.  Eyes: Conjunctivae are normal.  Cardiovascular: Normal rate.   Pulmonary/Chest: Effort normal.  Abdominal: Marisa Chambers exhibits no distension.  Musculoskeletal:  Left foot: No obvious swelling or deformity. Tenderness to palpation of anterior ankle and medial aspect of plantar foot. FROM. N/V intact. Normal gait - no limp  Neurological: Marisa Chambers is alert and oriented to person, place, and time.  Resting  tremor of left hand  Skin: Skin is warm and dry.  Psychiatric: Judgment and thought content normal. Her affect is blunt. Her speech is delayed. Marisa Chambers is slowed. Cognition and memory are normal.  Nursing note and vitals reviewed.   ED Course  Procedures (including critical care time) DIAGNOSTIC STUDIES: Oxygen Saturation is 100% on RA, normal by my interpretation.    COORDINATION OF CARE: 6:47 PM-Discussed treatment plan which includes order of  Benadryl, Foot Splint, and continued use of Ibuprofen with pt at bedside and pt agreed to plan.    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 50mg  Benadryl given here in ED with moderate relief although patient does still have a tremor. Rx for Benadryl given and patient is advised to follow up with Psych provider to have her dosage possibly adjusted since Marisa Chambers is also complaining of too much sedation. Patient verbalized understanding. Her left foot is most likely due to overuse injury. Marisa Chambers states Marisa Chambers does run on the treadmill for exercise. [facility] - I do not feel the need to repeat them here as there has not been an injury and it seems to be all soft tissue. Offered ASO brace and advised to continue Ibuprofen and ice as needed. Patient is NAD, non-toxic, with stable VS. Patient is informed of clinical course, understands medical decision making process, and agrees with plan. Opportunity for questions provided and all questions answered. Return precautions given.  I personally performed the services described in this documentation, which was scribed in my presence. The recorded information has been reviewed and is accurate.    Bethel BornKelly Marie Gable Odonohue, PA-C 07/13/15 2030  Arby BarretteMarcy Pfeiffer, MD 07/14/15 2018
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
